# Patient Record
Sex: Male | Born: 1985 | Race: Black or African American | Hispanic: No | Marital: Single | Smoking: Current every day smoker
Health system: Southern US, Community
[De-identification: ages and names within clinical notes are randomized; demographics above are authoritative.]

## PROBLEM LIST (undated history)

## (undated) DIAGNOSIS — M069 Rheumatoid arthritis, unspecified: Secondary | ICD-10-CM

## (undated) DIAGNOSIS — K5090 Crohn's disease, unspecified, without complications: Secondary | ICD-10-CM

---

## 2007-08-16 ENCOUNTER — Emergency Department: Payer: Self-pay | Admitting: Emergency Medicine

## 2007-12-21 ENCOUNTER — Emergency Department: Payer: Self-pay | Admitting: Emergency Medicine

## 2008-08-28 ENCOUNTER — Emergency Department: Payer: Self-pay | Admitting: Emergency Medicine

## 2009-03-23 IMAGING — CT CT ABD-PELV W/ CM
1 of 2 series · 15 of 32 positions shown, 19 images · non-contrast
Comparison: none

REASON FOR EXAM: History of Crohn's, weight loss, now with abdominal pain
COMMENTS:

[Series 2: abdomen · axial · 0.67mm/px · z∈[-444,-28]mm · 15 of 58 slices shown, 19 images]
[im 3/58  soft-tissue]
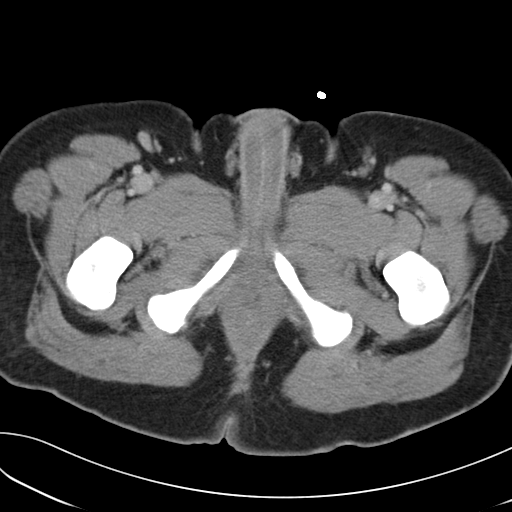
[im 3/58  bone]
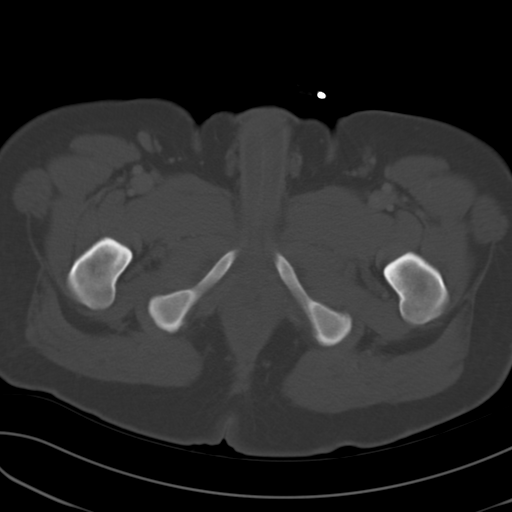
[im 8/58  soft-tissue]
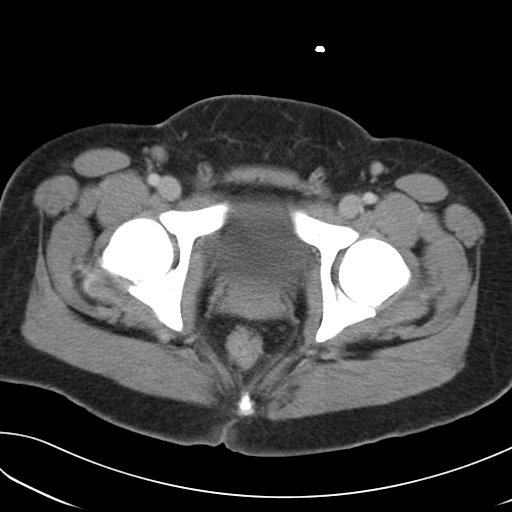
[im 13/58  soft-tissue]
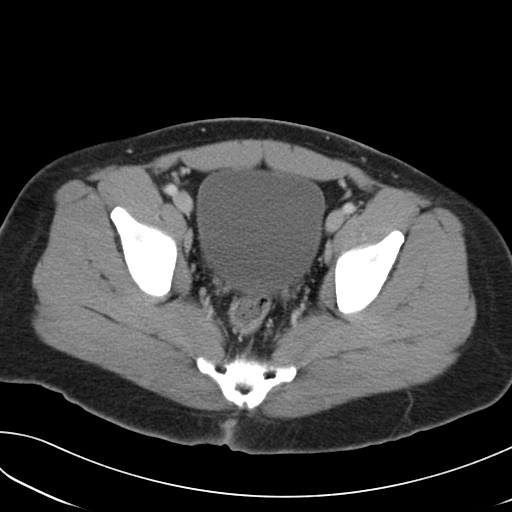
[im 15/58  soft-tissue]
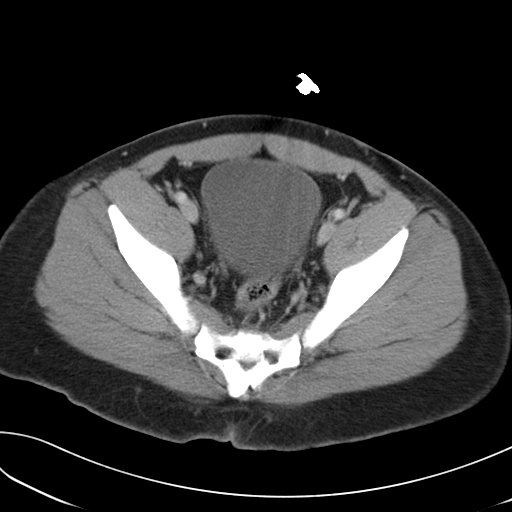
[im 20/58  soft-tissue]
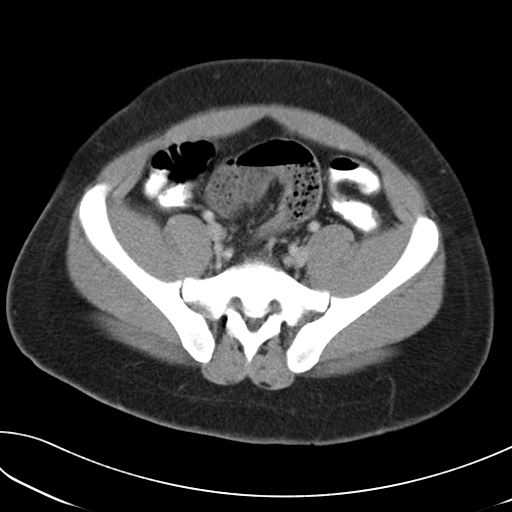
[im 25/58  soft-tissue]
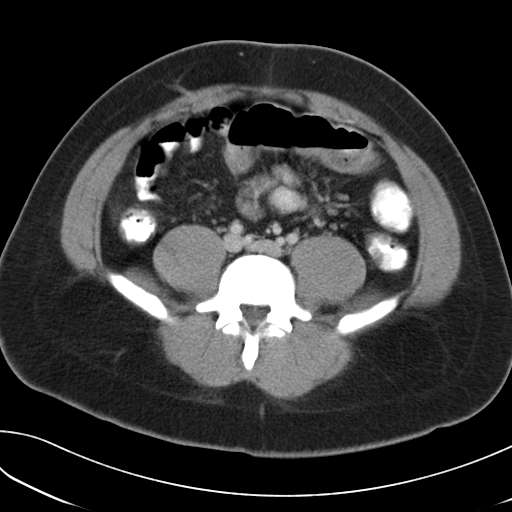
[im 30/58  soft-tissue]
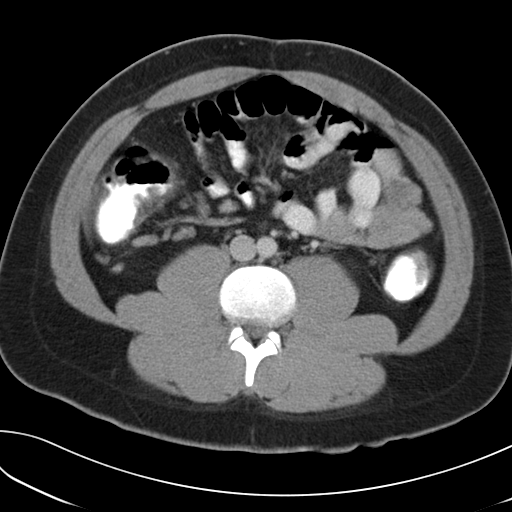
[im 33/58  soft-tissue]
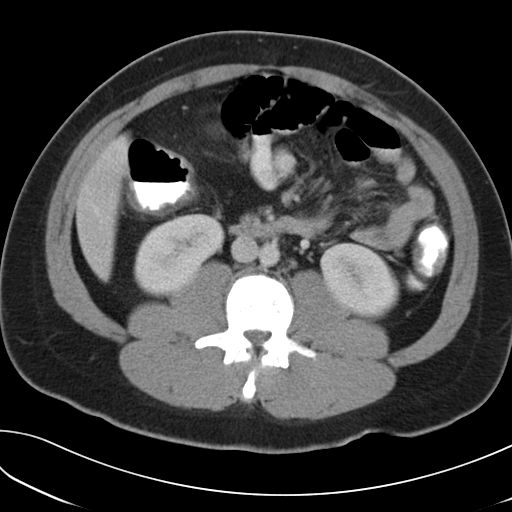
[im 38/58  soft-tissue]
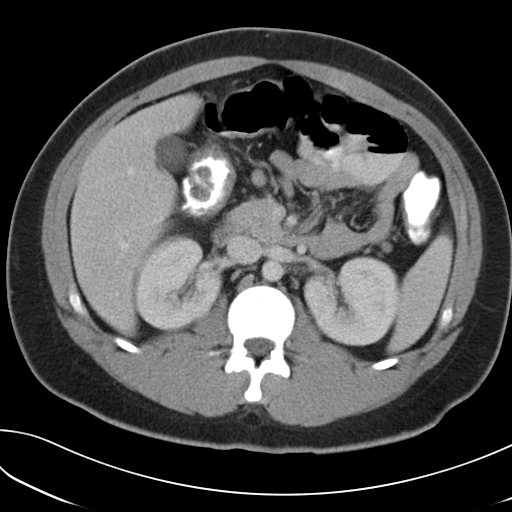
[im 38/58  bone]
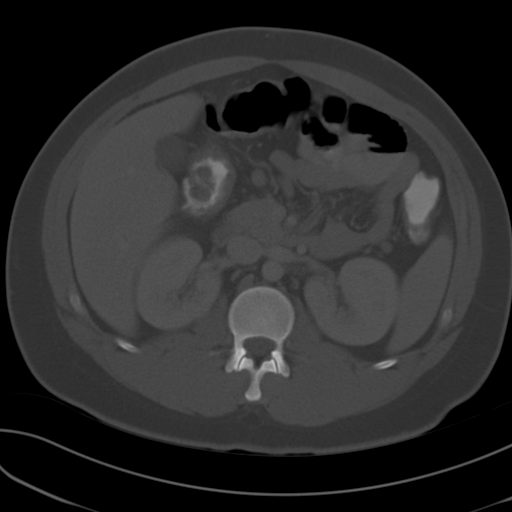
[im 43/58  soft-tissue]
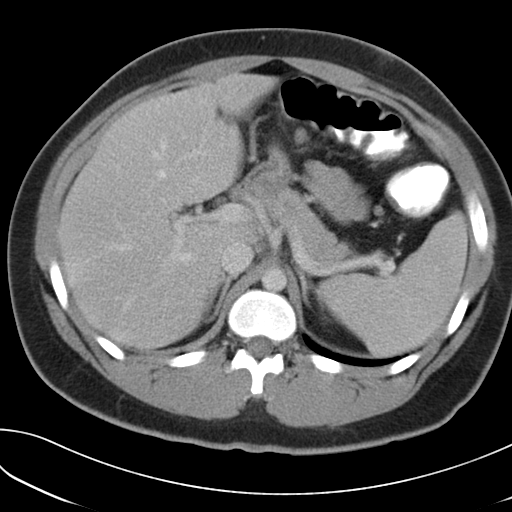
[im 45/58  soft-tissue]
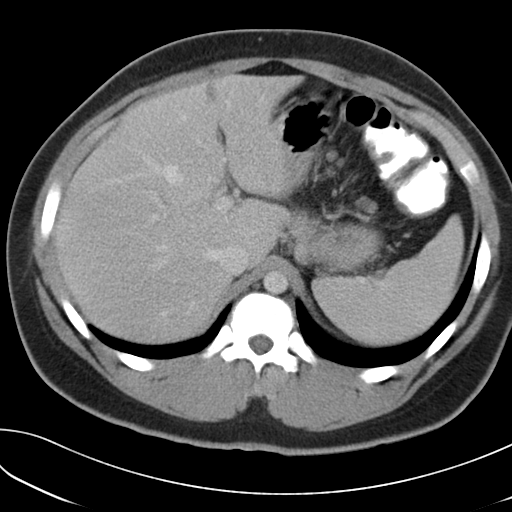
[im 48/58  lung]
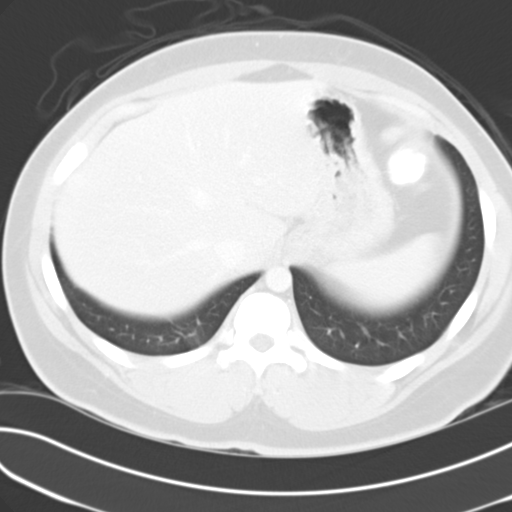
[im 50/58  soft-tissue]
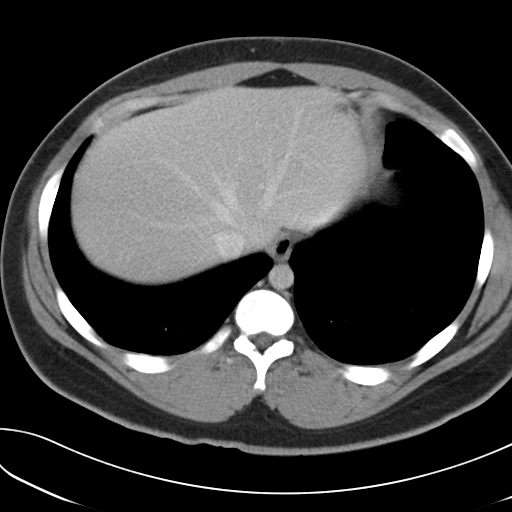
[im 50/58  lung]
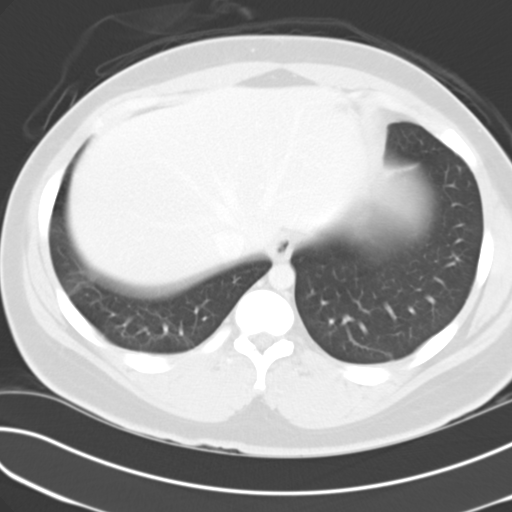
[im 53/58  lung]
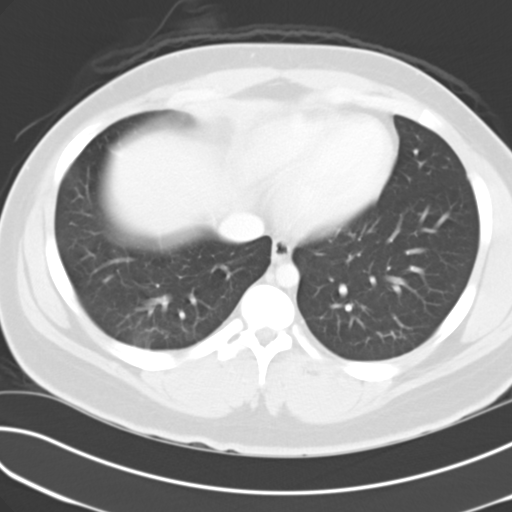
[im 55/58  soft-tissue]
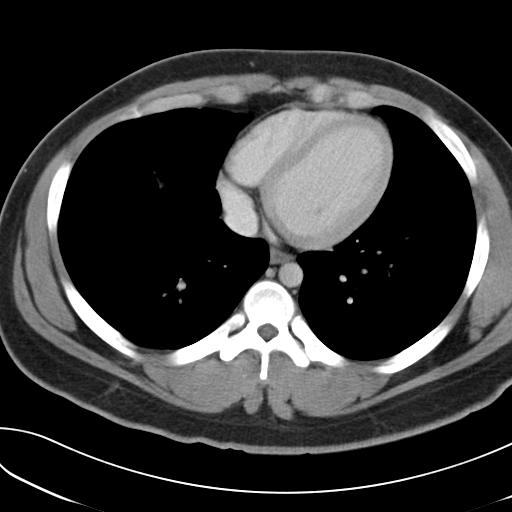
[im 55/58  lung]
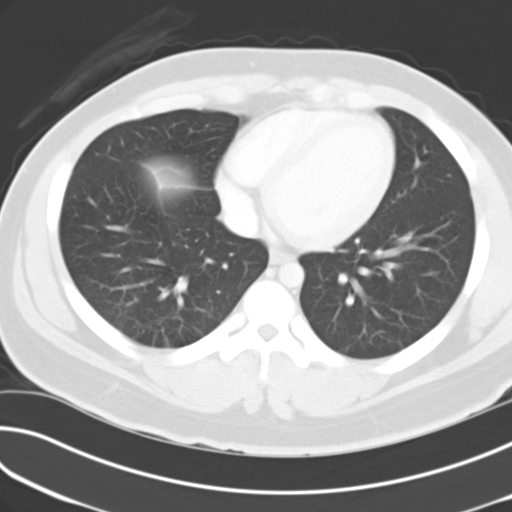

[15 of 32 positions shown; findings below may reference images not displayed]

PROCEDURE:     CT  - CT ABDOMEN / PELVIS  W  - August 16, 2007  [DATE]

RESULT:     Helical, 8.0 mm sections were obtained from the lung bases
through the pubic symphysis status post intravenous administration of 85 ml
of Lsovue-UXH.

Evaluation of the lung bases demonstrates no gross abnormalities.

The liver, spleen, adrenals, pancreas and kidneys are unremarkable. There is
no evidence of abdominal or pelvic free fluid, drainable loculated fluid
collections, masses or adenopathy. An area of bowel wall thickening is
appreciated in the region of the hepatic flexure as well as involving the
terminal ileum. There is mild surrounding inflammatory change. Prominent
mesenteric lymph nodes are identified within the pericecal region of the
bowel. No drainable, loculated fluid collections or masses are appreciated.
There is no CT evidence of an abdominal aortic aneurysm.
IMPRESSION: 1.     Bowel wall thickening involving the hepatic flexure of the colon as
well as the terminal ileum and pericecal lymph nodes. These findings are
consistent with a Crohn's flare considering the patient's history of Crohn's
disease. No drainable, loculated fluid collections are identified or
significant free fluid. There is no evidence of free air.
2.     Dr. Natal of the Emergency Department is informed of these
findings at the time of the initial interpretation.

## 2009-04-20 ENCOUNTER — Emergency Department: Payer: Self-pay | Admitting: Emergency Medicine

## 2020-11-26 ENCOUNTER — Other Ambulatory Visit: Payer: Self-pay

## 2020-11-26 ENCOUNTER — Emergency Department
Admission: EM | Admit: 2020-11-26 | Discharge: 2020-11-26 | Disposition: A | Discharge status: Home / Self Care | Payer: 59 | Attending: Emergency Medicine | Admitting: Emergency Medicine

## 2020-11-26 DIAGNOSIS — G43819 Other migraine, intractable, without status migrainosus: Secondary | ICD-10-CM

## 2020-11-26 DIAGNOSIS — R519 Headache, unspecified: Secondary | ICD-10-CM | POA: Diagnosis present

## 2020-11-26 DIAGNOSIS — G43809 Other migraine, not intractable, without status migrainosus: Secondary | ICD-10-CM | POA: Diagnosis not present

## 2020-11-26 MED ORDER — DIPHENHYDRAMINE HCL 50 MG/ML IJ SOLN
12.5000 mg | Freq: Once | INTRAMUSCULAR | Status: AC
  Administered 2020-11-26: 12.5 mg via INTRAVENOUS
  Filled 2020-11-26: qty 1

## 2020-11-26 MED ORDER — ACETAMINOPHEN 325 MG PO TABS
650.0000 mg | ORAL_TABLET | Freq: Once | ORAL | Status: AC
  Administered 2020-11-26: 650 mg via ORAL
  Filled 2020-11-26: qty 2

## 2020-11-26 MED ORDER — METOCLOPRAMIDE HCL 5 MG/ML IJ SOLN
10.0000 mg | Freq: Once | INTRAMUSCULAR | Status: AC
  Administered 2020-11-26: 10 mg via INTRAVENOUS
  Filled 2020-11-26: qty 2

## 2020-11-26 MED ORDER — SODIUM CHLORIDE 0.9 % IV BOLUS
1000.0000 mL | Freq: Once | INTRAVENOUS | Status: AC
Start: 2020-11-26 — End: 2020-11-26
  Administered 2020-11-26: 1000 mL via INTRAVENOUS

## 2020-11-26 NOTE — ED Triage Notes (Signed)
Pt here with a HA that started Tuesday. Pt has a hx of HA and migraines but states that he has not had one in a while. Pt denies any N/V/D.

## 2020-11-26 NOTE — Discharge Instructions (Addendum)
Take Tylenol 1 g every 8 hours and ibuprofen 600 every 6 hours with food.  You can follow-up with neurology if you continue to have headaches.  Return to the ER for fevers, worsening pain or any other concern

## 2020-11-26 NOTE — ED Provider Notes (Signed)
[facility] Emergency Department Provider Note  ____________________________________________   None    (approximate)  I have reviewed the triage vital signs and the nursing notes.   HISTORY  Chief Complaint Headache    HPI Woody Kronberg is a 35 y.o. male with Crohn's disease, on methotrexate he who comes in with concern for headache.  Patient reports having a history of migraines but states that this was lasting longer than previous.  States that it feels very similar with some pain in the back of his head and in the front of his head.  Denies any nausea, vomiting, waking him up from sleep and states that sometimes he has some difficulty falling asleep due to the headache.  He states that nothing is making it better or worse.  He did try a little bit of Tylenol.  He reports that the migraine was so bad that he had a miss work and so he is also here for a work note.  He denies any falls, hitting his head.  He reports that headache has been gradually worsening.  Was not sudden or severe in onset.     Medical: Crohn's disease  Prior to Admission medications   Not on File    Allergies Patient has no known allergies.  No family history on file.  Social History  Denies any IV drug use.  Does report smoking history  Review of Systems Constitutional: No fever/chills Eyes: No visual changes. ENT: No sore throat. Cardiovascular: Denies chest pain. Respiratory: Denies shortness of breath. Gastrointestinal: No abdominal pain.  No nausea, no vomiting.  No diarrhea.  No constipation. Genitourinary: Negative for dysuria. Musculoskeletal: Negative for back pain. Skin: Negative for rash. Neurological: Positive headache, no focal weakness or numbness. All other ROS negative ____________________________________________   PHYSICAL EXAM:  VITAL SIGNS: ED Triage Vitals  Enc Vitals Group     BP 11/26/20 1058 120/78     Pulse Rate 11/26/20 1058 96      Resp 11/26/20 1058 18     Temp 11/26/20 1058 98.4 F (36.9 C)     Temp Source 11/26/20 1058 Oral     SpO2 11/26/20 1058 100 %     Weight 11/26/20 1101 130 lb (59 kg)     Height 11/26/20 1101 5\' 5"  (1.651 m)     Head Circumference --      Peak Flow --      Pain Score 11/26/20 1100 6     Pain Loc --      Pain Edu? --      Excl. in GC? --     Constitutional: Alert and oriented. Well appearing and in no acute distress. Eyes: Conjunctivae are normal. EOMI. pupils are equal and reactive bilaterally Head: Atraumatic. Nose: No congestion/rhinnorhea. Mouth/Throat: Mucous membranes are moist.   Neck: No stridor. Trachea Midline. FROM Cardiovascular: Normal rate, regular rhythm. Grossly normal heart sounds.  Good peripheral circulation. Respiratory: Normal respiratory effort.  No retractions. Lungs CTAB. Gastrointestinal: Soft and nontender. No distention. No abdominal bruits.  Musculoskeletal: No lower extremity tenderness nor edema.  No joint effusions. Neurologic:  Normal speech and language. No gross focal neurologic deficits are appreciated.  Cranial nerves II to XII are intact.  Equal strength in arms and legs.  No numbness or tingling Skin:  Skin is warm, dry and intact. No rash noted. Psychiatric: Mood and affect are normal. Speech and behavior are normal. GU: Deferred   ____________________________________________     INITIAL IMPRESSION /  ASSESSMENT AND PLAN / ED COURSE  Renaldo Gornick was evaluated in Emergency Department on 11/26/2020 for the symptoms described in the history of present illness. He was evaluated in the context of the global COVID-19 pandemic, which necessitated consideration that the patient might be at risk for infection with the SARS-CoV-2 virus that causes COVID-19. Institutional protocols and algorithms that pertain to the evaluation of patients at risk for COVID-19 are in a state of rapid change based on information released by regulatory bodies including  the CDC and federal and state organizations. These policies and algorithms were followed during the patient's care in the ED.    Patient comes in with concerns for migraine headache.  Reports a prior history of this.  Patient is on methotrexate the patient is afebrile well-appearing no evidence of infection.  Doubt meningitis.  Given the description of pain doubt that this is a subarachnoid, tumor given no red flag symptoms.  We discussed CT imaging and patient would like to hold off at this time stating that he has had these headaches previously.  We will give migraine cocktail and reassess to see if we need to do CT imaging if not getting better   On reassessment patient states that his pain is much better and is okay with being discharged home at this time.  Again we discussed CT imaging but patient would like to hold off stating he feels better and feels comfortable going home  I discussed the provisional nature of ED diagnosis, the treatment so far, the ongoing plan of care, follow up appointments and return precautions with the patient and any family or support people present. They expressed understanding and agreed with the plan, discharged home.          ____________________________________________   FINAL CLINICAL IMPRESSION(S) / ED DIAGNOSES   Final diagnoses:  Other migraine without status migrainosus, intractable      MEDICATIONS GIVEN DURING THIS VISIT:  Medications  acetaminophen (TYLENOL) tablet 650 mg (650 mg Oral Given 11/26/20 1108)  metoCLOPramide (REGLAN) injection 10 mg (10 mg Intravenous Given 11/26/20 1108)  diphenhydrAMINE (BENADRYL) injection 12.5 mg (12.5 mg Intravenous Given 11/26/20 1108)  sodium chloride 0.9 % bolus 1,000 mL (1,000 mLs Intravenous New Bag/Given 11/26/20 1109)     ED Discharge Orders     None        Note:  This document was prepared using Dragon voice recognition software and may include unintentional dictation errors.     Concha Se, MD 11/26/20 [phone]

## 2020-11-26 NOTE — ED Provider Notes (Signed)
HPI: Pt is a 35 y.o. male who presents with complaints of headache  The patient p/w  headache started Tuesday, gradually getting worse, has h/o migraines. Tylenol, aleve.   ROS: Denies fever, chest pain, vomiting  Focused Physical Exam: Gen: No acute distress Head: atraumatic, normocephalic Eyes: Extraocular movements grossly intact; conjunctiva clear CV: RRR Lung: No increased WOB, no stridor GI: ND, no obvious masses Neuro: Alert and awake  Medical Decision Making and Plan: Given the patient's initial medical screening exam, the following diagnostic evaluation has been ordered. The patient will be placed in the appropriate treatment space, once one is available, to complete the evaluation and treatment. I have discussed the plan of care with the patient and I have advised the patient that an ED physician or mid-level practitioner will reevaluate their condition after the test results have been received, as the results may give them additional insight into the type of treatment they may need.   Diagnostics:  Treatments: none immediately   Concha Se, MD 11/26/20 1147

## 2021-12-30 ENCOUNTER — Emergency Department
Admission: EM | Admit: 2021-12-30 | Discharge: 2021-12-31 | Disposition: A | Discharge status: Other Acute Inpatient Hospital | Payer: Self-pay | Attending: Emergency Medicine | Admitting: Emergency Medicine

## 2021-12-30 ENCOUNTER — Other Ambulatory Visit: Payer: Self-pay

## 2021-12-30 DIAGNOSIS — R509 Fever, unspecified: Secondary | ICD-10-CM

## 2021-12-30 DIAGNOSIS — Z20822 Contact with and (suspected) exposure to covid-19: Secondary | ICD-10-CM | POA: Insufficient documentation

## 2021-12-30 DIAGNOSIS — K56609 Unspecified intestinal obstruction, unspecified as to partial versus complete obstruction: Secondary | ICD-10-CM | POA: Insufficient documentation

## 2021-12-30 DIAGNOSIS — K6389 Other specified diseases of intestine: Secondary | ICD-10-CM | POA: Insufficient documentation

## 2021-12-30 DIAGNOSIS — R112 Nausea with vomiting, unspecified: Secondary | ICD-10-CM

## 2021-12-30 HISTORY — DX: Rheumatoid arthritis, unspecified: M06.9

## 2021-12-30 HISTORY — DX: Crohn's disease, unspecified, without complications: K50.90

## 2021-12-30 LAB — COMPREHENSIVE METABOLIC PANEL
ALT: 14 U/L (ref 0–44)
AST: 25 U/L (ref 15–41)
Albumin: 3.7 g/dL (ref 3.5–5.0)
Alkaline Phosphatase: 53 U/L (ref 38–126)
Anion gap: 11 (ref 5–15)
BUN: 37 mg/dL — ABNORMAL HIGH (ref 6–20)
CO2: 25 mmol/L (ref 22–32)
Calcium: 8.7 mg/dL — ABNORMAL LOW (ref 8.9–10.3)
Chloride: 104 mmol/L (ref 98–111)
Creatinine, Ser: 1.25 mg/dL — ABNORMAL HIGH (ref 0.61–1.24)
GFR, Estimated: >60 mL/min (ref >60)
Glucose, Bld: 134 mg/dL — ABNORMAL HIGH (ref 70–99)
Potassium: 3.2 mmol/L — ABNORMAL LOW (ref 3.5–5.1)
Sodium: 140 mmol/L (ref 135–145)
Total Bilirubin: 0.7 mg/dL (ref 0.3–1.2)
Total Protein: 8.3 g/dL — ABNORMAL HIGH (ref 6.5–8.1)

## 2021-12-30 LAB — LIPASE, BLOOD: Lipase: 26 U/L (ref 11–51)

## 2021-12-30 LAB — CBC
HCT: 34 % — ABNORMAL LOW (ref 39.0–52.0)
Hemoglobin: 10.9 g/dL — ABNORMAL LOW (ref 13.0–17.0)
MCH: 29.2 pg (ref 26.0–34.0)
MCHC: 32.1 g/dL (ref 30.0–36.0)
MCV: 91.2 fL (ref 80.0–100.0)
Platelets: 336 10*3/uL (ref 150–400)
RBC: 3.73 MIL/uL — ABNORMAL LOW (ref 4.22–5.81)
RDW: 16.1 % — ABNORMAL HIGH (ref 11.5–15.5)
WBC: 5.3 10*3/uL (ref 4.0–10.5)
nRBC: 0 % (ref 0.0–0.2)

## 2021-12-30 MED ORDER — ONDANSETRON HCL 4 MG/2ML IJ SOLN
4.0000 mg | Freq: Once | INTRAMUSCULAR | Status: DC | PRN
  Filled 2021-12-30: qty 2

## 2021-12-30 MED ORDER — ACETAMINOPHEN 325 MG PO TABS
650.0000 mg | ORAL_TABLET | Freq: Once | ORAL | Status: AC | PRN
  Administered 2021-12-30: 650 mg via ORAL
  Filled 2021-12-30: qty 2

## 2021-12-30 NOTE — ED Notes (Signed)
First Nurse Note: Pt via EMS from home with complaints of N/V x 2 days. Hx of Crohns. Received 4mg  zofran via 18G LAC.   139 - CBG

## 2021-12-30 NOTE — ED Triage Notes (Signed)
Pt reports chrons flare x 2 days. No relief with home meds. Reports pain has increased as well as n/v. Pt alert and oriented. Breathing unlabored speaking in full sentences.

## 2021-12-31 ENCOUNTER — Emergency Department: Payer: Self-pay

## 2021-12-31 ENCOUNTER — Encounter: Payer: Self-pay | Admitting: Emergency Medicine

## 2021-12-31 LAB — URINALYSIS, ROUTINE W REFLEX MICROSCOPIC
Bilirubin Urine: NEGATIVE
Glucose, UA: NEGATIVE mg/dL
Hgb urine dipstick: NEGATIVE
Ketones, ur: 5 mg/dL — AB
Leukocytes,Ua: NEGATIVE
Nitrite: NEGATIVE
Protein, ur: 100 mg/dL — AB
Specific Gravity, Urine: 1.033 — ABNORMAL HIGH (ref 1.005–1.030)
Squamous Epithelial / HPF: NONE SEEN (ref 0–5)
pH: 5 (ref 5.0–8.0)

## 2021-12-31 LAB — RESP PANEL BY RT-PCR (FLU A&B, COVID) ARPGX2
Influenza A by PCR: NEGATIVE
Influenza B by PCR: NEGATIVE
SARS Coronavirus 2 by RT PCR: NEGATIVE

## 2021-12-31 MED ORDER — POTASSIUM CHLORIDE CRYS ER 20 MEQ PO TBCR
40.0000 meq | EXTENDED_RELEASE_TABLET | Freq: Once | ORAL | Status: AC
  Administered 2021-12-31: 40 meq via ORAL
  Filled 2021-12-31: qty 2

## 2021-12-31 MED ORDER — LACTATED RINGERS IV SOLN: INTRAVENOUS | Status: DC

## 2021-12-31 MED ORDER — LACTATED RINGERS IV BOLUS
1000.0000 mL | Freq: Once | INTRAVENOUS | Status: AC
  Administered 2021-12-31: 1000 mL via INTRAVENOUS

## 2021-12-31 MED ORDER — ACETAMINOPHEN 10 MG/ML IV SOLN: 1000.0000 mg | Freq: Four times a day (QID) | INTRAVENOUS | Status: DC | PRN

## 2021-12-31 MED ORDER — MORPHINE SULFATE (PF) 4 MG/ML IV SOLN
4.0000 mg | INTRAVENOUS | Status: DC | PRN
  Administered 2021-12-31 (×2): 4 mg via INTRAVENOUS
  Filled 2021-12-31 (×2): qty 1

## 2021-12-31 MED ORDER — ONDANSETRON HCL 4 MG/2ML IJ SOLN: 4.0000 mg | Freq: Four times a day (QID) | INTRAMUSCULAR | Status: DC | PRN

## 2021-12-31 MED ORDER — MORPHINE SULFATE (PF) 4 MG/ML IV SOLN
4.0000 mg | Freq: Once | INTRAVENOUS | Status: AC
  Administered 2021-12-31: 4 mg via INTRAVENOUS
  Filled 2021-12-31: qty 1

## 2021-12-31 MED ORDER — PIPERACILLIN-TAZOBACTAM 3.375 G IVPB 30 MIN
3.3750 g | Freq: Once | INTRAVENOUS | Status: AC
  Administered 2021-12-31: 3.375 g via INTRAVENOUS
  Filled 2021-12-31: qty 50

## 2021-12-31 MED ORDER — IOHEXOL 300 MG/ML  SOLN
100.0000 mL | Freq: Once | INTRAMUSCULAR | Status: AC | PRN
  Administered 2021-12-31: 100 mL via INTRAVENOUS

## 2021-12-31 MED ORDER — ONDANSETRON HCL 4 MG/2ML IJ SOLN
4.0000 mg | Freq: Once | INTRAMUSCULAR | Status: AC
  Administered 2021-12-31: 4 mg via INTRAVENOUS
  Filled 2021-12-31: qty 2

## 2021-12-31 NOTE — ED Notes (Signed)
Pt accepted to [facility] per [facility]

## 2021-12-31 NOTE — ED Notes (Signed)
Pt in bed, pt requests pain med, pain med given.  °

## 2021-12-31 NOTE — ED Notes (Signed)
Pt to ct 

## 2021-12-31 NOTE — ED Notes (Addendum)
Writer received callback from [facility] crew chief on transport of this pt. Per crew chief Missy, crew members have been busy with no sleep. Currently 5 trucks out on calls and there is no way to transport pt before 0700 AM. Secretary and MD made aware.

## 2021-12-31 NOTE — ED Notes (Signed)
Called [facility] transport (Rob) for assistance with transport. Pt on [facility] transport list for after 7 am

## 2021-12-31 NOTE — ED Notes (Signed)
Verta Ellen, MD per Forrester Blando, MD

## 2021-12-31 NOTE — ED Provider Notes (Signed)
[facility] Provider Note    Event Date/Time   First MD Initiated Contact with Patient 12/30/21 2350     (approximate)   History   Abdominal Pain   HPI  Brent Perry is a 36 y.o. male with history of Crohn's disease on methotrexate and Humira who presents to the emergency department with complaints of fevers, nausea, vomiting, diarrhea and generalized abdominal pain today.  No bloody stools or melena.  No previous abdominal surgery.  Receives his care at [facility].   History provided by patient and mother.    Past Medical History:  Diagnosis Date   Crohn's disease (HCC)    Rheumatoid arthritis (HCC)     History reviewed. No pertinent surgical history.  MEDICATIONS:  Prior to Admission medications   Not on File    Physical Exam   Triage Vital Signs: ED Triage Vitals  Enc Vitals Group     BP 12/30/21 2201 107/69     Pulse Rate 12/30/21 2201 (!) 104     Resp 12/30/21 2201 19     Temp 12/30/21 2206 (!) 100.7 F (38.2 C)     Temp Source 12/30/21 2201 Oral     SpO2 12/30/21 2201 98 %     Weight 12/30/21 2201 135 lb (61.2 kg)     Height 12/30/21 2201 5\' 5"  (1.651 m)     Head Circumference --      Peak Flow --      Pain Score 12/30/21 2201 10     Pain Loc --      Pain Edu? --      Excl. in GC? --     Most recent vital signs: Vitals:   12/31/21 0000 12/31/21 0135  BP: 121/67 (!) 107/53  Pulse: 97 (!) 102  Resp:    Temp:    SpO2: 99% 100%    CONSTITUTIONAL: Alert and oriented and responds appropriately to questions. Well-appearing; well-nourished HEAD: Normocephalic, atraumatic EYES: Conjunctivae clear, pupils appear equal, sclera nonicteric ENT: normal nose; moist mucous membranes NECK: Supple, normal ROM CARD: RRR; S1 and S2 appreciated; no murmurs, no clicks, no rubs, no gallops RESP: Normal chest excursion without splinting or tachypnea; breath sounds clear and equal bilaterally; no wheezes, no rhonchi, no rales, no hypoxia or  respiratory distress, speaking full sentences ABD/GI: Normal bowel sounds; non-distended; soft, generalized abdominal tenderness worse in the right lower quadrant without guarding or rebound BACK: The back appears normal EXT: Normal ROM in all joints; no deformity noted, no edema; no cyanosis SKIN: Normal color for age and race; warm; no rash on exposed skin NEURO: Moves all extremities equally, normal speech PSYCH: The patient's mood and manner are appropriate.   ED Results / Procedures / Treatments   LABS: (all labs ordered are listed, but only abnormal results are displayed) Labs Reviewed  COMPREHENSIVE METABOLIC PANEL - Abnormal; Notable for the following components:      Result Value   Potassium 3.2 (*)    Glucose, Bld 134 (*)    BUN 37 (*)    Creatinine, Ser 1.25 (*)    Calcium 8.7 (*)    Total Protein 8.3 (*)    All other components within normal limits  CBC - Abnormal; Notable for the following components:   RBC 3.73 (*)    Hemoglobin 10.9 (*)    HCT 34.0 (*)    RDW 16.1 (*)    All other components within normal limits  URINALYSIS, ROUTINE W REFLEX MICROSCOPIC -  Abnormal; Notable for the following components:   Color, Urine AMBER (*)    APPearance HAZY (*)    Specific Gravity, Urine 1.033 (*)    Ketones, ur 5 (*)    Protein, ur 100 (*)    Bacteria, UA MANY (*)    All other components within normal limits  RESP PANEL BY RT-PCR (FLU A&B, COVID) ARPGX2  LIPASE, BLOOD     EKG:   RADIOLOGY: My personal review and interpretation of imaging: CT shows high-grade bowel obstruction due to mass in the terminal ileum.  I have personally reviewed all radiology reports.   DG Abd Portable 1 View  Result Date: 12/31/2021 CLINICAL DATA:  NG tube placement EXAM: PORTABLE ABDOMEN - 1 VIEW COMPARISON:  CT today FINDINGS: NG tube is in the stomach.  Visualized lungs clear. IMPRESSION: NG tube in the stomach. Electronically Signed   By: Charlett Nose M.D.   On: 12/31/2021 01:50    CT ABDOMEN PELVIS W CONTRAST  Result Date: 12/31/2021 CLINICAL DATA:  Crohn's exacerbation Abdominal pain, acute, nonlocalized. Per ed notes:" Pt reports chrons flare x 2 days. No relief with home meds. Reports pain has increased as well as n/v. Pt alert and oriented. EXAM: CT ABDOMEN AND PELVIS WITH CONTRAST TECHNIQUE: Multidetector CT imaging of the abdomen and pelvis was performed using the standard protocol following bolus administration of intravenous contrast. RADIATION DOSE REDUCTION: This exam was performed according to the departmental dose-optimization program which includes automated exposure control, adjustment of the mA and/or kV according to patient size and/or use of iterative reconstruction technique. CONTRAST:  OMNIPAQUE IOHEXOL 300 MG/ML  SOLN COMPARISON:  None Available. FINDINGS: Lower chest: No acute abnormality. Hepatobiliary: No focal liver abnormality. No gallstones, gallbladder wall thickening, or pericholecystic fluid. No biliary dilatation. Pancreas: No focal lesion. Normal pancreatic contour. No surrounding inflammatory changes. No main pancreatic ductal dilatation. Spleen: Normal in size without focal abnormality. Adrenals/Urinary Tract: No adrenal nodule bilaterally. Bilateral kidneys enhance symmetrically. Subcentimeter hypodensities are too small To characterize - no further follow-up indicated. No hydronephrosis. No hydroureter. The urinary bladder is unremarkable. Stomach/Bowel: Stomach is within normal limits. 6 x 3.5 cm masslike marked irregular bowel wall thickening of the expected region of the cecum and terminal ileum (2:48) with associated diffuse proximal small bowel fluid dilatation with associated air-fluid levels. Transition point is at the mass. Suggestion of an inflamed and also thickened appendiceal stump posterior to the cecum (6:32). Vascular/Lymphatic: No abdominal aorta or iliac aneurysm. Multiple prominent in some enlarged right lower quadrant lymph  nodes: 1.1 cm (2:57). No pelvic or inguinal lymphadenopathy. Reproductive: Prostate is unremarkable. Other: No intraperitoneal free fluid. No intraperitoneal free gas. No organized fluid collection. Musculoskeletal: No abdominal wall hernia or abnormality. No suspicious lytic or blastic osseous lesions. No acute displaced fracture. At least moderate degenerative changes of the right hip and severe degenerative changes of the left hip. Bilateral sacroiliac joint degenerative changes. No ankylosis. IMPRESSION: 1. 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 Recommend surgical consultation. 2. At least moderate degenerative changes of the right hip and severe degenerative changes of the left hip. Electronically Signed   By: Tish Frederickson M.D.   On: 12/31/2021 00:46     PROCEDURES:  Critical Care performed: No     Procedures    IMPRESSION / MDM / ASSESSMENT AND PLAN / ED COURSE  I reviewed the triage vital signs and the nursing notes.    Patient  here with fevers, vomiting, diarrhea, abdominal pain.  The patient is on the cardiac monitor to evaluate for evidence of arrhythmia and/or significant heart rate changes.   DIFFERENTIAL DIAGNOSIS (includes but not limited to):   Crohn's exacerbation, viral gastroenteritis, appendicitis, infectious colitis, diverticulitis, bowel obstruction   Patient's presentation is most consistent with acute presentation with potential threat to life or bodily function.   PLAN: Labs obtained in triage.  No leukocytosis.  Mildly elevated creatinine of 1.25.  Potassium of 3.2.  Given oral replacement.  Normal LFTs and lipase.  Will obtain urinalysis, CT of the abdomen pelvis.  Will give IV fluids, pain and nausea medicine.  Received Tylenol in triage.   MEDICATIONS GIVEN IN ED: Medications  lactated ringers infusion ( Intravenous New Bag/Given 12/31/21 0134)   piperacillin-tazobactam (ZOSYN) IVPB 3.375 g (3.375 g Intravenous New Bag/Given 12/31/21 0131)  acetaminophen (TYLENOL) tablet 650 mg (650 mg Oral Given 12/30/21 2210)  morphine (PF) 4 MG/ML injection 4 mg (4 mg Intravenous Given 12/31/21 0016)  lactated ringers bolus 1,000 mL (0 mLs Intravenous Stopped 12/31/21 0120)  ondansetron (ZOFRAN) injection 4 mg (4 mg Intravenous Given 12/31/21 0016)  iohexol (OMNIPAQUE) 300 MG/ML solution 100 mL (100 mLs Intravenous Contrast Given 12/31/21 0027)  potassium chloride SA (KLOR-CON M) CR tablet 40 mEq (40 mEq Oral Given 12/31/21 0035)  morphine (PF) 4 MG/ML injection 4 mg (4 mg Intravenous Given 12/31/21 0130)     ED COURSE: CT of the abdomen pelvis concerning for mass suggestive of possible malignancy in the terminal ileum causing a high-grade bowel obstruction.  No known history of malignancy.  Will place NG tube.  Discussed these findings with patient and that he will need admission, likely surgical intervention.  He would prefer to be transferred to [facility] if beds are available given he receives all of his GI care there.  Will keep n.p.o. and continue IV fluids.   1:25 AM  Spoke with our surgeon Dr. Tonna Boehringer who agrees that patient would be better served at [facility].  Waiting to speak with surgeon at [facility].   1:55 AM  Pt accepted to [facility] by Dr. Neysa Hotter with GI surgery.  Appreciate [facility] assistance with this patient.   Patient and mother updated at bedside.   CONSULTS: Our general surgeon Dr. Tonna Boehringer consulted.  Also discussed with Dr. Neysa Hotter with GI surgery at [facility] who is accepted the patient for admission.   OUTSIDE RECORDS REVIEWED:  Reviewed last GI visit at [facility] 12/23/21.       FINAL CLINICAL IMPRESSION(S) / ED DIAGNOSES   Final diagnoses:  Nausea vomiting and diarrhea  Fever, unspecified fever cause  SBO (small bowel obstruction) (HCC)  Small bowel mass     Rx / DC Orders   ED Discharge Orders     None        Note:  This document was  prepared using Dragon voice recognition software and may include unintentional dictation errors.   Wilberto Console, Layla Maw, DO 12/31/21 [phone]

## 2021-12-31 NOTE — ED Notes (Signed)
Pt in bed, family at bedside, pt reports some throat pain, pt has no requests at this time, pt awaits transport

## 2021-12-31 NOTE — ED Notes (Addendum)
Pt in bed, pt states that he is more comfortable and has no requests at this time.

## 2021-12-31 NOTE — ED Notes (Signed)
Report to Tiffany with [employer] and she informed it would be after 0700 before transport would be available for ETA.

## 2021-12-31 NOTE — ED Notes (Signed)
Pt verbalized understand and consent for transfer to [facility], [facility] at bedside, pt has no requests before transfer.  Called [facility] at [phone] and gave report to RN Eliseo Gum, states that she has no further questions before transport.

## 2021-12-31 NOTE — ED Notes (Signed)
Called [facility] transfer Clair Gulling) per Doyne Micke, MD for surgical transfer

## 2021-12-31 NOTE — ED Provider Notes (Signed)
7:23 AM  Care assumed from Dr. Elesa Massed, patient is currently waiting to be transferred to [facility].  Has a history of Crohn's disease and had a CT scan concerning for small bowel obstruction from a new malignancy.  NG tube in place.  Recommended transfer to [facility] after discussion with Dr. Tonna Boehringer general surgery at this hospital.   Corena Herter, MD 12/31/21 [phone]
